# Patient Record
Sex: Female | Born: 1945 | Race: White | Hispanic: No | Marital: Married | Smoking: Never smoker
Health system: Southern US, Community
[De-identification: ages and names within clinical notes are randomized; demographics above are authoritative.]

## PROBLEM LIST (undated history)

## (undated) DIAGNOSIS — R519 Headache, unspecified: Secondary | ICD-10-CM

## (undated) DIAGNOSIS — E785 Hyperlipidemia, unspecified: Secondary | ICD-10-CM

## (undated) DIAGNOSIS — C801 Malignant (primary) neoplasm, unspecified: Secondary | ICD-10-CM

## (undated) DIAGNOSIS — E119 Type 2 diabetes mellitus without complications: Secondary | ICD-10-CM

## (undated) DIAGNOSIS — N6011 Diffuse cystic mastopathy of right breast: Secondary | ICD-10-CM

## (undated) DIAGNOSIS — I10 Essential (primary) hypertension: Secondary | ICD-10-CM

## (undated) HISTORY — PX: OTHER SURGICAL HISTORY: SHX169

## (undated) HISTORY — PX: COLONOSCOPY WITH PROPOFOL: SHX5780

## (undated) HISTORY — PX: ABDOMINAL HYSTERECTOMY: SHX81

## (undated) HISTORY — PX: LAMINECTOMY: SHX219

---

## 1999-01-17 HISTORY — PX: BREAST BIOPSY: SHX20

## 2002-01-16 HISTORY — PX: BREAST BIOPSY: SHX20

## 2004-05-20 ENCOUNTER — Ambulatory Visit: Payer: Self-pay | Admitting: Internal Medicine

## 2004-05-25 ENCOUNTER — Ambulatory Visit: Payer: Self-pay | Admitting: Internal Medicine

## 2004-05-31 ENCOUNTER — Ambulatory Visit: Payer: Self-pay | Admitting: Internal Medicine

## 2004-09-29 ENCOUNTER — Ambulatory Visit: Payer: Self-pay | Admitting: Obstetrics and Gynecology

## 2005-10-02 ENCOUNTER — Ambulatory Visit: Payer: Self-pay | Admitting: Obstetrics and Gynecology

## 2006-11-08 ENCOUNTER — Ambulatory Visit: Payer: Self-pay | Admitting: Obstetrics and Gynecology

## 2007-11-12 ENCOUNTER — Ambulatory Visit: Payer: Self-pay | Admitting: Obstetrics and Gynecology

## 2008-11-13 ENCOUNTER — Ambulatory Visit: Payer: Self-pay | Admitting: Obstetrics and Gynecology

## 2009-11-17 ENCOUNTER — Ambulatory Visit: Payer: Self-pay | Admitting: Obstetrics and Gynecology

## 2009-11-30 ENCOUNTER — Ambulatory Visit: Payer: Self-pay | Admitting: Obstetrics and Gynecology

## 2011-01-04 ENCOUNTER — Ambulatory Visit: Payer: Self-pay | Admitting: Obstetrics and Gynecology

## 2011-09-07 ENCOUNTER — Ambulatory Visit: Payer: Self-pay | Admitting: Obstetrics and Gynecology

## 2011-09-07 LAB — URINALYSIS, COMPLETE
Bilirubin,UR: NEGATIVE
Glucose,UR: NEGATIVE mg/dL (ref 0–75)
Ketone: NEGATIVE
Leukocyte Esterase: NEGATIVE
Nitrite: NEGATIVE
Ph: 7 (ref 4.5–8.0)
RBC,UR: 1 /HPF (ref 0–5)
Squamous Epithelial: 2

## 2011-09-07 LAB — POTASSIUM: Potassium: 3.4 mmol/L — ABNORMAL LOW (ref 3.5–5.1)

## 2011-09-11 ENCOUNTER — Ambulatory Visit: Payer: Self-pay | Admitting: Obstetrics and Gynecology

## 2011-09-12 LAB — PATHOLOGY REPORT

## 2011-11-28 ENCOUNTER — Ambulatory Visit: Payer: Self-pay | Admitting: Obstetrics and Gynecology

## 2011-11-28 LAB — URINALYSIS, COMPLETE
Bilirubin,UR: NEGATIVE
Blood: NEGATIVE
Glucose,UR: >=500 mg/dL (ref 0–75)
Ketone: NEGATIVE
Leukocyte Esterase: NEGATIVE
Ph: 6 (ref 4.5–8.0)
Protein: NEGATIVE
RBC,UR: NONE SEEN /HPF (ref 0–5)
Specific Gravity: 1 (ref 1.003–1.030)

## 2011-11-28 LAB — POTASSIUM: Potassium: 3.2 mmol/L — ABNORMAL LOW (ref 3.5–5.1)

## 2011-12-04 ENCOUNTER — Ambulatory Visit: Payer: Self-pay | Admitting: Obstetrics and Gynecology

## 2011-12-05 LAB — PATHOLOGY REPORT

## 2011-12-05 LAB — HEMATOCRIT: HCT: 33.8 % — ABNORMAL LOW (ref 35.0–47.0)

## 2012-02-07 ENCOUNTER — Ambulatory Visit: Payer: Self-pay | Admitting: Obstetrics and Gynecology

## 2012-02-20 ENCOUNTER — Ambulatory Visit: Payer: Self-pay | Admitting: Neurology

## 2013-02-25 ENCOUNTER — Ambulatory Visit: Payer: Self-pay | Admitting: Obstetrics and Gynecology

## 2013-04-24 DIAGNOSIS — E782 Mixed hyperlipidemia: Secondary | ICD-10-CM | POA: Insufficient documentation

## 2013-04-24 DIAGNOSIS — I10 Essential (primary) hypertension: Secondary | ICD-10-CM | POA: Insufficient documentation

## 2013-04-24 DIAGNOSIS — E118 Type 2 diabetes mellitus with unspecified complications: Secondary | ICD-10-CM | POA: Insufficient documentation

## 2013-11-10 DIAGNOSIS — R413 Other amnesia: Secondary | ICD-10-CM | POA: Insufficient documentation

## 2013-11-10 DIAGNOSIS — E611 Iron deficiency: Secondary | ICD-10-CM | POA: Insufficient documentation

## 2013-11-24 DIAGNOSIS — R32 Unspecified urinary incontinence: Secondary | ICD-10-CM | POA: Insufficient documentation

## 2014-03-10 ENCOUNTER — Ambulatory Visit: Payer: Self-pay | Admitting: Obstetrics and Gynecology

## 2014-05-05 NOTE — Op Note (Signed)
PATIENT NAME:  Catherine Cross, Catherine Cross MR#:  [id number] DATE OF BIRTH:  [birth year]  DATE OF PROCEDURE:  12/04/2011  PREOPERATIVE DIAGNOSES:  1. Postmenopausal bleeding.  2. Status post dilation and curettage with benign endometrium. 3. Recurrent polyps.   POSTOPERATIVE DIAGNOSES:  1. Postmenopausal bleeding.  2. Status post dilation and curettage with benign endometrium. 3. Recurrent polyps.   PROCEDURE: Laparoscopic supracervical hysterectomy, bilateral salpingo-oophorectomy.   SURGEON: Ricky L. Amalia Hailey, MD  ASSISTANT: Laverta Baltimore, MD  ANESTHESIA: General endotracheal.   FINDINGS: Uterus with bilateral omental adhesions to the adnexal complexes, grossly normal tubes, otherwise small fibroid evident on the uterus, excellent hemostasis and cosmesis.   ESTIMATED BLOOD LOSS: 50 mL.  COMPLICATIONS: None.   DRAINS: Intraoperative Foley, was discontinued at the end of the case.   SPECIMENS: Uterus, tubes, and ovaries.   ANTIBIOTICS: One gram Ancef given IV preoperatively.   PROCEDURE IN DETAIL: The patient was consented. Preoperative antibiotics were given. She was taken to the Operating Room and placed in the supine position. Anesthesia was initiated. She was prepped and draped in the usual sterile fashion. The cervix was visualized. Single-tooth and sound were placed and Steri-Stripped together. Foley catheter was placed. Attention was then turned to the abdomen.   An 11 port was placed infraumbilically and pneumoperitoneum was established. The patient was placed in Trendelenburg position and 11 mm ports were placed in bilateral lower quadrants. I then began dissection using the ALTRUS device releasing the omental complex on the left side, divided the round ligament, and identified and divided the IP. Electrocautery was used for hemostasis and for lysing. Developed the left broad ligament and cauterized the left uterine artery. We proceeded in a similar fashion on the right. We were  unable to develop the uterine stump using the ALTRUS. I attempted to use disposable scissors; these were too light for this procedure. Therefore, I opened a Harmonic scalpel and developed the cervical stump in the usual fashion. The cervix was removed in morcellator fashion through the left lower quadrant wound. The left lower quadrant port was replaced, the pelvis was copiously irrigated, and areas were seen to be hemostatic. Cervical canal was cauterized while hand of operator was in the vagina to ensure no upper vaginal trauma. Pressure was lowered to 6 mmHg. The area was again seen to be hemostatic. The procedure was felt to have achieved maximum efficacy. Ports were removed. Pneumoperitoneum was allowed to resolve. Incisions were closed with deep of zero and subcutaneous with 3-0 Vicryl. Steri-Strips and Band-Aids were placed.  The patient tolerated the procedure well. 26 mL of 0.5% Sensorcaine was used in three separate aliquots prior to port placement. The patient tolerated the procedure well. I anticipate a routine postoperative course.  ____________________________ Rockey Situ. Amalia Hailey, MD rle:slb D: 12/04/2011 09:34:42 ET T: 12/04/2011 10:33:05 ET JOB#: [id number]  cc: Ricky L. Amalia Hailey, MD, <Dictator> Selmer Dominion MD ELECTRONICALLY SIGNED 12/06/2011 10:46

## 2014-05-05 NOTE — Op Note (Signed)
PATIENT NAME:  Catherine Cross, WAH MR#:  [id number] DATE OF BIRTH:  21-Apr-1945  DATE OF PROCEDURE:  09/11/2011  PREOPERATIVE DIAGNOSIS: Postmenopausal bleeding with thickened endometrium with benign biopsy in the office.   POSTOPERATIVE DIAGNOSES:    1. Postmenopausal bleeding with thickened endometrium with benign biopsy in the office.  2. Polyp.   PROCEDURES:  1. Hysteroscopy.  2. Dilation and curettage.   SURGEON: Ricky L. Amalia Hailey, M.D.   ANESTHESIA: General endotracheal.   FINDINGS: Fleshy polyp, unremarkable hysteroscopy otherwise. Minimal amount of curettings otherwise.  ESTIMATED BLOOD LOSS: Minimal.  COMPLICATIONS: None.  SPECIMENS: Curettings with polyp.  DRAINS: In and out catheter with red rubber at the end of the case, approximately 30 mL.   PROCEDURE IN DETAIL: The patient has been on minimal hormone replacement therapy for several years. Spotting after recent adjustment of dosage. Ultrasound showed thickened endometrium, biopsy benign. Recommended dilation and curettage. The patient consented. Consent signed. Preoperative antibiotics given. Taken to the Operating Room and placed in the supine position where anesthesia was initiated, prepped and draped in the usual sterile fashion. After being placed in the dorsal lithotomy position using Allen stirrups, the cervix was visualized, grasped with a single-tooth tenaculum, easily dilated to permit hysteroscope with findings as noted above.   Polyp was seen to be at approximately 10 o'clock position and approximately two-thirds of the way up toward the fundus. This was then removed blind with polyp forceps. Picture was taken to assure complete removal and then general global curettage was carried out. There was good uterine cry felt throughout and minimal return of curettings. The procedure was felt to achieve maximum efficacy. Instruments were removed. Cervix was seen to be hemostatic. The bladder was drained. The patient was turned to  the supine position and left to the care of anesthesia.   The patient tolerated the procedure well. I anticipate a routine postoperative course. I should note that she has significant cystocele which seems to be asymptomatic, but it would most likely require repair should the patient desire correction.   ____________________________ Rockey Situ. Amalia Hailey, MD rle:ap D: 09/11/2011 08:06:27 ET T: 09/11/2011 10:54:35 ET JOB#: [id number]  cc: Audry Pili L. Amalia Hailey, MD, <Dictator> Selmer Dominion MD ELECTRONICALLY SIGNED 09/12/2011 9:19

## 2014-07-28 DIAGNOSIS — N898 Other specified noninflammatory disorders of vagina: Secondary | ICD-10-CM | POA: Insufficient documentation

## 2014-09-08 DIAGNOSIS — C44310 Basal cell carcinoma of skin of unspecified parts of face: Secondary | ICD-10-CM | POA: Insufficient documentation

## 2014-11-17 DIAGNOSIS — E538 Deficiency of other specified B group vitamins: Secondary | ICD-10-CM | POA: Insufficient documentation

## 2014-12-14 DIAGNOSIS — N813 Complete uterovaginal prolapse: Secondary | ICD-10-CM | POA: Insufficient documentation

## 2015-03-11 DIAGNOSIS — M67449 Ganglion, unspecified hand: Secondary | ICD-10-CM | POA: Insufficient documentation

## 2015-05-13 ENCOUNTER — Other Ambulatory Visit: Payer: Self-pay | Admitting: Obstetrics and Gynecology

## 2015-05-13 DIAGNOSIS — Z1231 Encounter for screening mammogram for malignant neoplasm of breast: Secondary | ICD-10-CM

## 2015-05-18 ENCOUNTER — Other Ambulatory Visit: Payer: Self-pay | Admitting: Obstetrics and Gynecology

## 2015-05-18 ENCOUNTER — Ambulatory Visit
Admission: RE | Admit: 2015-05-18 | Discharge: 2015-05-18 | Disposition: A | Discharge status: Home / Self Care | Payer: Medicare Other | Source: Ambulatory Visit | Attending: Obstetrics and Gynecology | Admitting: Obstetrics and Gynecology

## 2015-05-18 DIAGNOSIS — Z1231 Encounter for screening mammogram for malignant neoplasm of breast: Secondary | ICD-10-CM | POA: Insufficient documentation

## 2015-05-21 ENCOUNTER — Other Ambulatory Visit: Payer: Self-pay | Admitting: Obstetrics and Gynecology

## 2015-05-21 DIAGNOSIS — N6459 Other signs and symptoms in breast: Secondary | ICD-10-CM

## 2015-05-25 ENCOUNTER — Other Ambulatory Visit: Payer: Self-pay | Admitting: Obstetrics and Gynecology

## 2015-05-25 DIAGNOSIS — N6489 Other specified disorders of breast: Secondary | ICD-10-CM

## 2015-05-27 ENCOUNTER — Ambulatory Visit
Admission: RE | Admit: 2015-05-27 | Discharge: 2015-05-27 | Disposition: A | Discharge status: Home / Self Care | Payer: Medicare Other | Source: Ambulatory Visit | Attending: Obstetrics and Gynecology | Admitting: Obstetrics and Gynecology

## 2015-05-27 DIAGNOSIS — N6002 Solitary cyst of left breast: Secondary | ICD-10-CM | POA: Diagnosis not present

## 2015-05-27 DIAGNOSIS — N6489 Other specified disorders of breast: Secondary | ICD-10-CM | POA: Diagnosis present

## 2015-06-21 DIAGNOSIS — N819 Female genital prolapse, unspecified: Secondary | ICD-10-CM | POA: Insufficient documentation

## 2015-06-21 DIAGNOSIS — N3946 Mixed incontinence: Secondary | ICD-10-CM | POA: Insufficient documentation

## 2016-05-05 ENCOUNTER — Other Ambulatory Visit: Payer: Self-pay | Admitting: Internal Medicine

## 2016-05-05 DIAGNOSIS — Z1231 Encounter for screening mammogram for malignant neoplasm of breast: Secondary | ICD-10-CM

## 2016-05-25 ENCOUNTER — Ambulatory Visit
Admission: RE | Admit: 2016-05-25 | Discharge: 2016-05-25 | Disposition: A | Discharge status: Home / Self Care | Payer: Medicare Other | Source: Ambulatory Visit | Attending: Internal Medicine | Admitting: Internal Medicine

## 2016-05-25 ENCOUNTER — Encounter (HOSPITAL_COMMUNITY): Payer: Self-pay

## 2016-05-25 DIAGNOSIS — Z1231 Encounter for screening mammogram for malignant neoplasm of breast: Secondary | ICD-10-CM | POA: Diagnosis present

## 2016-12-20 ENCOUNTER — Other Ambulatory Visit: Payer: Self-pay | Admitting: Internal Medicine

## 2016-12-20 DIAGNOSIS — R748 Abnormal levels of other serum enzymes: Secondary | ICD-10-CM

## 2016-12-28 ENCOUNTER — Ambulatory Visit
Admission: RE | Admit: 2016-12-28 | Discharge: 2016-12-28 | Disposition: A | Discharge status: Home / Self Care | Payer: Medicare Other | Source: Ambulatory Visit | Attending: Internal Medicine | Admitting: Internal Medicine

## 2016-12-28 DIAGNOSIS — K760 Fatty (change of) liver, not elsewhere classified: Secondary | ICD-10-CM | POA: Insufficient documentation

## 2016-12-28 DIAGNOSIS — R748 Abnormal levels of other serum enzymes: Secondary | ICD-10-CM | POA: Insufficient documentation

## 2016-12-28 DIAGNOSIS — K824 Cholesterolosis of gallbladder: Secondary | ICD-10-CM | POA: Insufficient documentation

## 2017-03-20 IMAGING — MG MM DIGITAL DIAGNOSTIC UNILAT*L* W/ TOMO W/ CAD
6 series · 6 of 14 positions shown · non-contrast
Comparison: Previous exam(s).

CLINICAL DATA: Callback for possible left breast asymmetry

EXAM:
2D DIGITAL DIAGNOSTIC LEFT MAMMOGRAM WITH CAD AND ADJUNCT TOMO
ULTRASOUND LEFT BREAST

[L CC synth-2D]
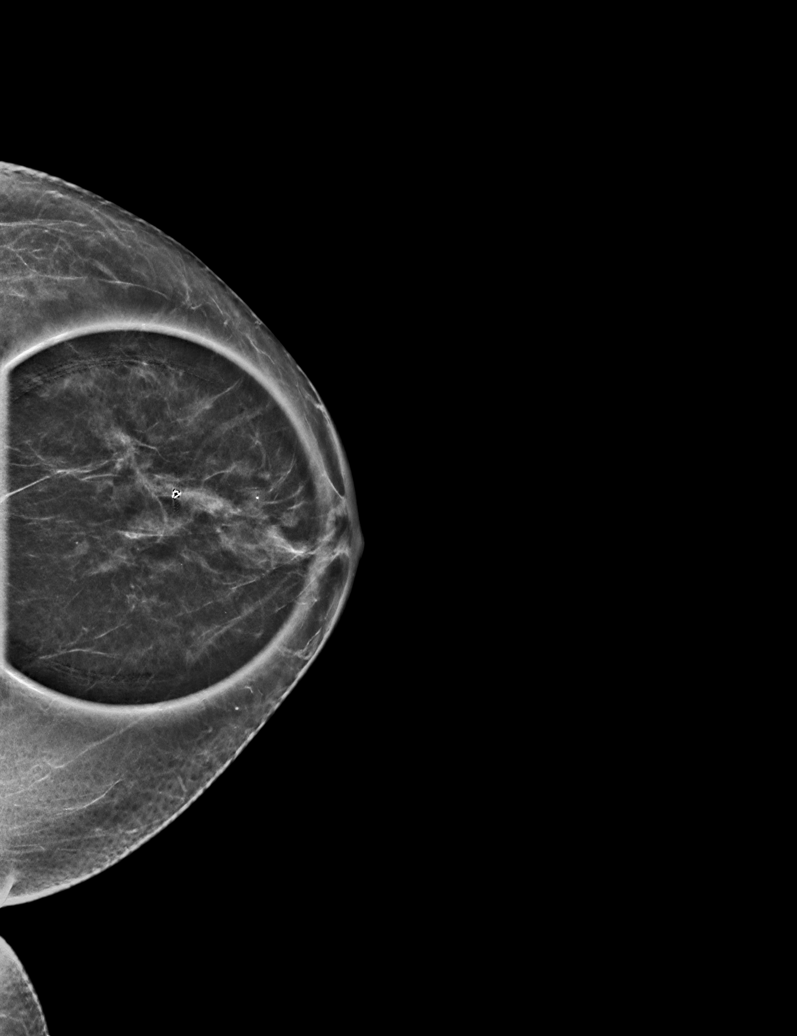

[L ML]
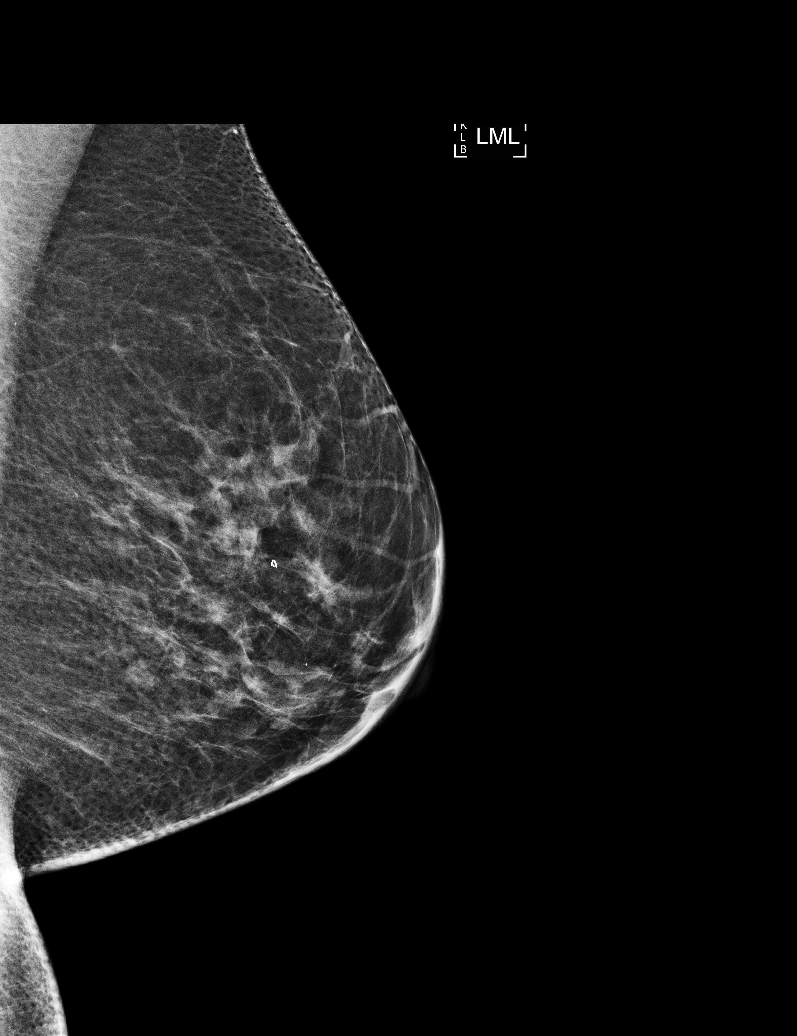

[L ML synth-2D]
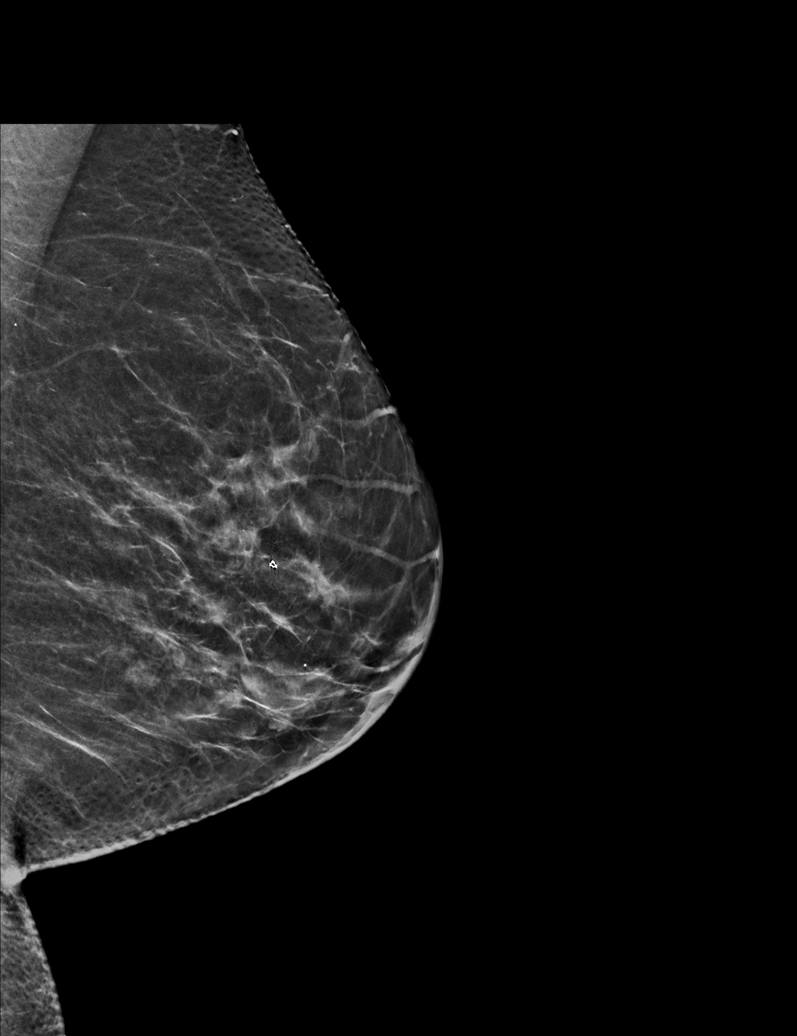

[L CC]
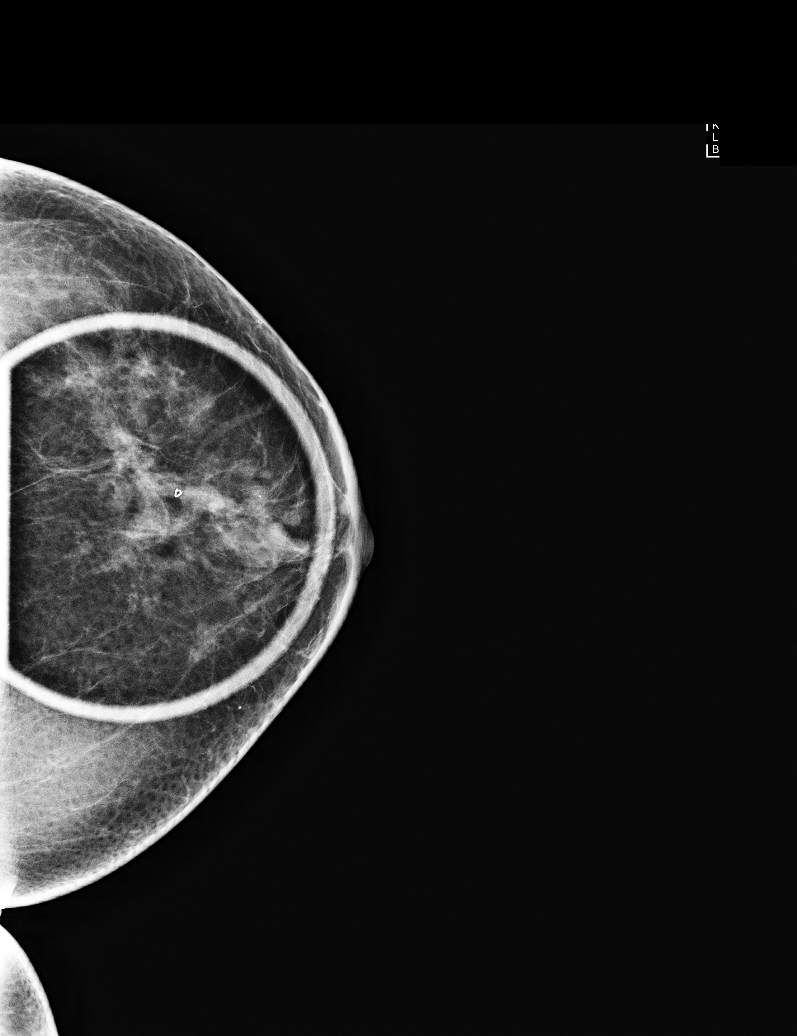

[L ML tomo · tomo slice 29/57.0]
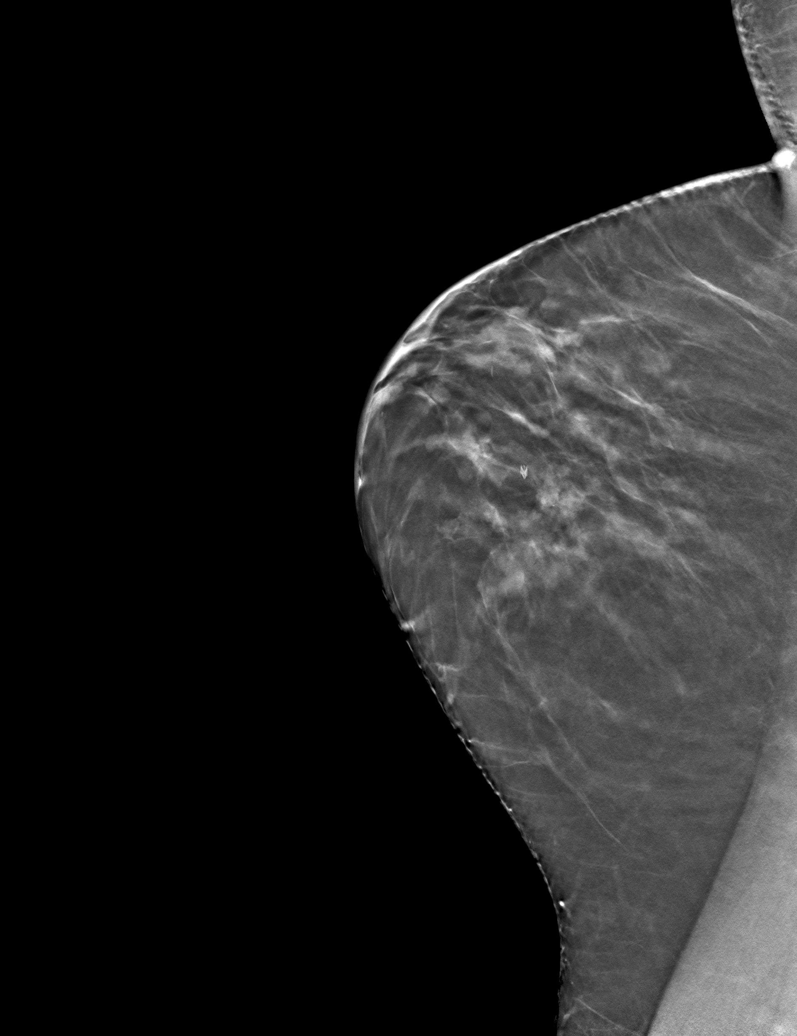

[L CC tomo · tomo slice 25/48.0]
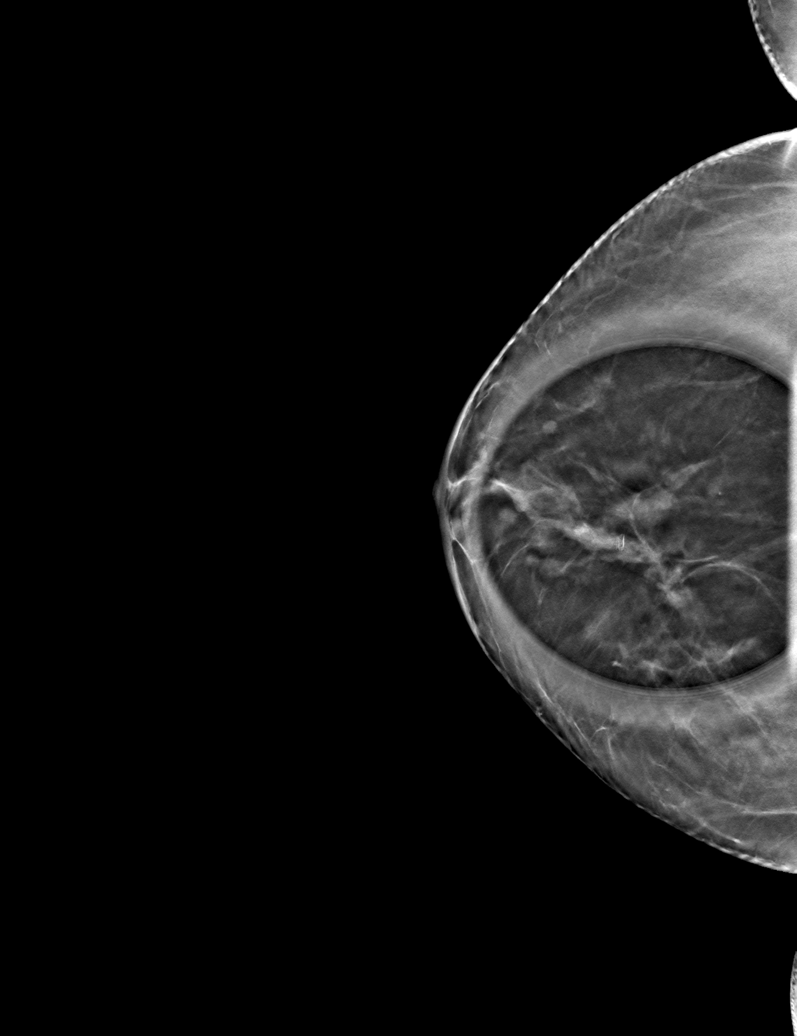

[6 of 14 positions shown; findings below may reference images not displayed]

ACR Breast Density Category c: The breast tissue is heterogeneously
dense, which may obscure small masses.
FINDINGS: CC spot compression view and full lateral views of the left breast
were performed with tomosynthesis. The possible asymmetry identified
on screening mammogram does not persist on spot compression view. No
suspicious abnormality is seen in this region.

Mammographic images were processed with CAD.

On physical exam, no discrete mass is felt in the area of concern
within the slightly lateral left breast.

Targeted ultrasound of the slightly lateral, subareolar left breast
was performed demonstrating no suspicious cystic or solid
sonographic finding in the area of concern. Numerous small cysts
were incidentally noted.
IMPRESSION: No mammographic or sonographic evidence of malignancy.

RECOMMENDATION:
Screening mammogram in one year.(Code:89-3-2V3)

I have discussed the findings and recommendations with the patient.
Results were also provided in writing at the conclusion of the
visit. If applicable, a reminder letter will be sent to the patient
regarding the next appointment.

BI-RADS CATEGORY  1: Negative.

## 2017-06-01 ENCOUNTER — Other Ambulatory Visit: Payer: Self-pay | Admitting: Obstetrics and Gynecology

## 2017-06-22 ENCOUNTER — Other Ambulatory Visit: Payer: Self-pay | Admitting: Obstetrics and Gynecology

## 2017-06-22 DIAGNOSIS — Z1231 Encounter for screening mammogram for malignant neoplasm of breast: Secondary | ICD-10-CM

## 2017-07-24 ENCOUNTER — Ambulatory Visit
Admission: RE | Admit: 2017-07-24 | Discharge: 2017-07-24 | Disposition: A | Discharge status: Home / Self Care | Payer: MEDICARE | Source: Ambulatory Visit | Attending: Obstetrics and Gynecology | Admitting: Obstetrics and Gynecology

## 2017-07-24 DIAGNOSIS — Z1231 Encounter for screening mammogram for malignant neoplasm of breast: Secondary | ICD-10-CM | POA: Diagnosis not present

## 2017-07-24 HISTORY — DX: Malignant (primary) neoplasm, unspecified: C80.1

## 2018-06-25 ENCOUNTER — Other Ambulatory Visit: Payer: Self-pay | Admitting: Obstetrics and Gynecology

## 2018-06-25 DIAGNOSIS — Z1231 Encounter for screening mammogram for malignant neoplasm of breast: Secondary | ICD-10-CM

## 2018-09-10 ENCOUNTER — Ambulatory Visit
Admission: RE | Admit: 2018-09-10 | Discharge: 2018-09-10 | Disposition: A | Discharge status: Home / Self Care | Payer: Medicare HMO | Source: Ambulatory Visit | Attending: Obstetrics and Gynecology | Admitting: Obstetrics and Gynecology

## 2018-09-10 DIAGNOSIS — Z1231 Encounter for screening mammogram for malignant neoplasm of breast: Secondary | ICD-10-CM | POA: Diagnosis not present

## 2019-07-01 ENCOUNTER — Other Ambulatory Visit: Payer: Self-pay | Admitting: Obstetrics and Gynecology

## 2019-07-01 DIAGNOSIS — Z1231 Encounter for screening mammogram for malignant neoplasm of breast: Secondary | ICD-10-CM

## 2019-09-11 ENCOUNTER — Inpatient Hospital Stay: Admission: RE | Admit: 2019-09-11 | Payer: Medicare Other | Source: Ambulatory Visit

## 2019-10-03 ENCOUNTER — Ambulatory Visit
Admission: RE | Admit: 2019-10-03 | Discharge: 2019-10-03 | Disposition: A | Discharge status: Home / Self Care | Payer: Medicare HMO | Source: Ambulatory Visit | Attending: Obstetrics and Gynecology | Admitting: Obstetrics and Gynecology

## 2019-10-03 ENCOUNTER — Other Ambulatory Visit: Payer: Self-pay

## 2019-10-03 DIAGNOSIS — Z1231 Encounter for screening mammogram for malignant neoplasm of breast: Secondary | ICD-10-CM | POA: Diagnosis not present

## 2020-07-21 ENCOUNTER — Other Ambulatory Visit: Payer: Self-pay | Admitting: Obstetrics and Gynecology

## 2020-07-21 DIAGNOSIS — Z1231 Encounter for screening mammogram for malignant neoplasm of breast: Secondary | ICD-10-CM

## 2020-10-04 ENCOUNTER — Other Ambulatory Visit: Payer: Self-pay

## 2020-10-04 ENCOUNTER — Ambulatory Visit
Admission: RE | Admit: 2020-10-04 | Discharge: 2020-10-04 | Disposition: A | Discharge status: Home / Self Care | Payer: Medicare HMO | Source: Ambulatory Visit | Attending: Obstetrics and Gynecology | Admitting: Obstetrics and Gynecology

## 2020-10-04 DIAGNOSIS — Z1231 Encounter for screening mammogram for malignant neoplasm of breast: Secondary | ICD-10-CM | POA: Diagnosis not present

## 2020-10-12 ENCOUNTER — Other Ambulatory Visit: Payer: Self-pay | Admitting: Obstetrics and Gynecology

## 2020-10-12 DIAGNOSIS — R928 Other abnormal and inconclusive findings on diagnostic imaging of breast: Secondary | ICD-10-CM

## 2020-10-12 DIAGNOSIS — N6489 Other specified disorders of breast: Secondary | ICD-10-CM

## 2020-10-18 ENCOUNTER — Ambulatory Visit
Admission: RE | Admit: 2020-10-18 | Discharge: 2020-10-18 | Disposition: A | Discharge status: Home / Self Care | Payer: Medicare HMO | Source: Ambulatory Visit | Attending: Obstetrics and Gynecology | Admitting: Obstetrics and Gynecology

## 2020-10-18 ENCOUNTER — Other Ambulatory Visit: Payer: Self-pay

## 2020-10-18 DIAGNOSIS — N6489 Other specified disorders of breast: Secondary | ICD-10-CM | POA: Insufficient documentation

## 2020-10-18 DIAGNOSIS — R928 Other abnormal and inconclusive findings on diagnostic imaging of breast: Secondary | ICD-10-CM | POA: Insufficient documentation

## 2020-11-24 ENCOUNTER — Encounter: Payer: Self-pay | Admitting: Gastroenterology

## 2020-11-25 ENCOUNTER — Ambulatory Visit: Payer: Medicare HMO | Admitting: Anesthesiology

## 2020-11-25 ENCOUNTER — Encounter
Admission: RE | Disposition: A | Discharge status: Home / Self Care | Payer: Self-pay | Source: Ambulatory Visit | Attending: Gastroenterology

## 2020-11-25 ENCOUNTER — Other Ambulatory Visit: Payer: Self-pay

## 2020-11-25 ENCOUNTER — Encounter: Payer: Self-pay | Admitting: Gastroenterology

## 2020-11-25 ENCOUNTER — Ambulatory Visit
Admission: RE | Admit: 2020-11-25 | Discharge: 2020-11-25 | Disposition: A | Discharge status: Home / Self Care | Payer: Medicare HMO | Source: Ambulatory Visit | Attending: Gastroenterology | Admitting: Gastroenterology

## 2020-11-25 DIAGNOSIS — Z7982 Long term (current) use of aspirin: Secondary | ICD-10-CM | POA: Diagnosis not present

## 2020-11-25 DIAGNOSIS — K5730 Diverticulosis of large intestine without perforation or abscess without bleeding: Secondary | ICD-10-CM | POA: Diagnosis not present

## 2020-11-25 DIAGNOSIS — Z791 Long term (current) use of non-steroidal anti-inflammatories (NSAID): Secondary | ICD-10-CM | POA: Insufficient documentation

## 2020-11-25 DIAGNOSIS — D125 Benign neoplasm of sigmoid colon: Secondary | ICD-10-CM | POA: Insufficient documentation

## 2020-11-25 DIAGNOSIS — K640 First degree hemorrhoids: Secondary | ICD-10-CM | POA: Diagnosis not present

## 2020-11-25 DIAGNOSIS — Z1211 Encounter for screening for malignant neoplasm of colon: Secondary | ICD-10-CM | POA: Insufficient documentation

## 2020-11-25 DIAGNOSIS — E119 Type 2 diabetes mellitus without complications: Secondary | ICD-10-CM | POA: Diagnosis not present

## 2020-11-25 DIAGNOSIS — Z7984 Long term (current) use of oral hypoglycemic drugs: Secondary | ICD-10-CM | POA: Insufficient documentation

## 2020-11-25 HISTORY — DX: Type 2 diabetes mellitus without complications: E11.9

## 2020-11-25 HISTORY — DX: Headache, unspecified: R51.9

## 2020-11-25 HISTORY — DX: Diffuse cystic mastopathy of right breast: N60.11

## 2020-11-25 HISTORY — DX: Essential (primary) hypertension: I10

## 2020-11-25 HISTORY — PX: COLONOSCOPY: SHX5424

## 2020-11-25 HISTORY — DX: Hyperlipidemia, unspecified: E78.5

## 2020-11-25 LAB — GLUCOSE, CAPILLARY: Glucose-Capillary: 153 mg/dL — ABNORMAL HIGH (ref 70–99)

## 2020-11-25 SURGERY — COLONOSCOPY
Anesthesia: General

## 2020-11-25 MED ORDER — SODIUM CHLORIDE 0.9 % IV SOLN: INTRAVENOUS | Status: DC

## 2020-11-25 MED ORDER — LIDOCAINE HCL (CARDIAC) PF 100 MG/5ML IV SOSY
PREFILLED_SYRINGE | INTRAVENOUS | Status: DC | PRN
  Administered 2020-11-25: 50 mg via INTRAVENOUS

## 2020-11-25 MED ORDER — PROPOFOL 500 MG/50ML IV EMUL
INTRAVENOUS | Status: AC
  Filled 2020-11-25: qty 50

## 2020-11-25 MED ORDER — PROPOFOL 500 MG/50ML IV EMUL
INTRAVENOUS | Status: DC | PRN
  Administered 2020-11-25: 150 ug/kg/min via INTRAVENOUS

## 2020-11-25 MED ORDER — SIMETHICONE 40 MG/0.6ML PO SUSP
ORAL | Status: DC | PRN
  Administered 2020-11-25: 100 mL

## 2020-11-25 MED ORDER — PROPOFOL 10 MG/ML IV BOLUS
INTRAVENOUS | Status: DC | PRN
  Administered 2020-11-25: 60 mg via INTRAVENOUS

## 2020-11-25 NOTE — Anesthesia Procedure Notes (Signed)
Date/Time: 11/25/2020 8:25 AM Performed by: Johnna Acosta, CRNA Pre-anesthesia Checklist: Patient identified, Emergency Drugs available, Suction available, Patient being monitored and Timeout performed Patient Re-evaluated:Patient Re-evaluated prior to induction Oxygen Delivery Method: Nasal cannula Preoxygenation: Pre-oxygenation with 100% oxygen Induction Type: IV induction

## 2020-11-25 NOTE — Transfer of Care (Signed)
Immediate Anesthesia Transfer of Care Note  Patient: Catherine Cross  Procedure(s) Performed: COLONOSCOPY  Patient Location: PACU  Anesthesia Type:General  Level of Consciousness: sedated  Airway & Oxygen Therapy: Patient Spontanous Breathing  Post-op Assessment: Report given to RN and Post -op Vital signs reviewed and stable  Post vital signs: Reviewed and stable  Last Vitals:  Vitals Value Taken Time  BP 114/58 11/25/20 0856  Temp    Pulse 70 11/25/20 0856  Resp 16 11/25/20 0856  SpO2 99 % 11/25/20 0856  Vitals shown include unvalidated device data.  Last Pain:  Vitals:   11/25/20 0804  TempSrc: Temporal  PainSc: 0-No pain         Complications: No notable events documented.

## 2020-11-25 NOTE — Interval H&P Note (Signed)
History and Physical Interval Note: Preprocedure H&P from 11/25/20  was reviewed and there was no interval change after seeing and examining the patient.  Written consent was obtained from the patient after discussion of risks, benefits, and alternatives. Patient has consented to proceed with Colonoscopy with possible intervention   11/25/2020 8:21 AM  Catherine Cross  has presented today for surgery, with the diagnosis of Colon cancer screening (Z12.11).  The various methods of treatment have been discussed with the patient and family. After consideration of risks, benefits and other options for treatment, the patient has consented to  Procedure(s) with comments: COLONOSCOPY (N/A) - DM as a surgical intervention.  The patient's history has been reviewed, patient examined, no change in status, stable for surgery.  I have reviewed the patient's chart and labs.  Questions were answered to the patient's satisfaction.     [name]

## 2020-11-25 NOTE — Op Note (Signed)
[facility] Patient Name: Catherine Cross Procedure Date: 11/25/2020 8:14 AM MRN: [id number] Account #: [id number] Date of Birth: 26-Oct-1945 Admit Type: Outpatient Age: 75 Room: [facility] ENDO ROOM 1 Gender: Female Note Status: Finalized Instrument Name: Colonoscope [id number] Procedure:             Colonoscopy Indications:           Screening for colorectal malignant neoplasm Providers:             [name] DO, DO Referring MD:          Boykin Nearing, MD (Referring MD) Medicines:             Monitored Anesthesia Care Complications:         No immediate complications. Estimated blood loss:                         Minimal. Procedure:             Pre-Anesthesia Assessment:                        - Prior to the procedure, a History and Physical was                         performed, and patient medications and allergies were                         reviewed. The patient is competent. The risks and                         benefits of the procedure and the sedation options and                         risks were discussed with the patient. All questions                         were answered and informed consent was obtained.                         Patient identification and proposed procedure were                         verified by the physician, the nurse, the anesthetist                         and the technician in the endoscopy suite. Mental                         Status Examination: alert and oriented. Airway                         Examination: normal oropharyngeal airway and neck                         mobility. Respiratory Examination: clear to                         auscultation. CV Examination: RRR, no murmurs, no S3  or S4. Prophylactic Antibiotics: The patient does not                         require prophylactic antibiotics. Prior                         Anticoagulants: The patient has taken no  previous                         anticoagulant or antiplatelet agents. ASA Grade                         Assessment: III - A patient with severe systemic                         disease. After reviewing the risks and benefits, the                         patient was deemed in satisfactory condition to                         undergo the procedure. The anesthesia plan was to use                         monitored anesthesia care (MAC). Immediately prior to                         administration of medications, the patient was                         re-assessed for adequacy to receive sedatives. The                         heart rate, respiratory rate, oxygen saturations,                         blood pressure, adequacy of pulmonary ventilation, and                         response to care were monitored throughout the                         procedure. The physical status of the patient was                         re-assessed after the procedure.                        After obtaining informed consent, the colonoscope was                         passed under direct vision. Throughout the procedure,                         the patient's blood pressure, pulse, and oxygen                         saturations were monitored continuously. The  Colonoscope was introduced through the anus and                         advanced to the the cecum, identified by appendiceal                         orifice and ileocecal valve. The colonoscopy was                         performed without difficulty. The patient tolerated                         the procedure well. The quality of the bowel                         preparation was evaluated using the BBPS [facility] Bowel                         Preparation Scale) with scores of: Right Colon = 3,                         Transverse Colon = 3 and Left Colon = 3 (entire mucosa                         seen well with no residual staining, small  fragments                         of stool or opaque liquid). The total BBPS score                         equals 9. The ileocecal valve, appendiceal orifice,                         and rectum were photographed. Findings:      The perianal and digital rectal examinations were normal. Pertinent       negatives include normal sphincter tone.      A 2 to 3 mm polyp was found in the sigmoid colon. The polyp was sessile.       The polyp was removed with a cold biopsy forceps. Resection and       retrieval were complete. Estimated blood loss was minimal.      Multiple small-mouthed diverticula were found in the recto-sigmoid colon       and sigmoid colon. Estimated blood loss: none.      Non-bleeding internal hemorrhoids were found during retroflexion. The       hemorrhoids were Grade I (internal hemorrhoids that do not prolapse).      The exam was otherwise without abnormality on direct and retroflexion       views. Impression:            - One 2 to 3 mm polyp in the sigmoid colon, removed                         with a cold biopsy forceps. Resected and retrieved.                        - Diverticulosis in the recto-sigmoid colon and in  the                         sigmoid colon.                        - Non-bleeding internal hemorrhoids.                        - The examination was otherwise normal on direct and                         retroflexion views. Recommendation:        - Discharge patient to home.                        - Resume previous diet.                        - Continue present medications.                        - Await pathology results.                        - Repeat colonoscopy for surveillance based on                         pathology results.                        - Return to referring physician as previously                         scheduled. Procedure Code(s):     --- Professional ---                        [phone], Colonoscopy, flexible; with biopsy, single or                          multiple Diagnosis Code(s):     --- Professional ---                        Z12.11, Encounter for screening for malignant neoplasm                         of colon                        K63.5, Polyp of colon                        K64.0, First degree hemorrhoids                        K57.30, Diverticulosis of large intestine without                         perforation or abscess without bleeding CPT copyright 2019 [employer]. All rights reserved. The codes documented in this report are preliminary and upon coder review may  be revised to meet current compliance requirements. Attending Participation:      I personally performed the  entire procedure. Volney American, DO [name] DO, DO 11/25/2020 8:56:10 AM This report has been signed electronically. Number of Addenda: 0 Note Initiated On: 11/25/2020 8:14 AM Scope Withdrawal Time: 0 hours 13 minutes 58 seconds  Total Procedure Duration: 0 hours 24 minutes 32 seconds  Estimated Blood Loss:  Estimated blood loss was minimal.      [facility]

## 2020-11-25 NOTE — Anesthesia Preprocedure Evaluation (Signed)
Anesthesia Evaluation  Patient identified by MRN, date of birth, ID band Patient awake    Reviewed: Allergy & Precautions, NPO status , Patient's Chart, lab work & pertinent test results  History of Anesthesia Complications Negative for: history of anesthetic complications  Airway Mallampati: III  TM Distance: <3 FB Neck ROM: full    Dental  (+) Chipped   Pulmonary neg pulmonary ROS, neg shortness of breath,    Pulmonary exam normal        Cardiovascular negative cardio ROS Normal cardiovascular exam     Neuro/Psych  Headaches, negative psych ROS   GI/Hepatic negative GI ROS, Neg liver ROS, neg GERD  ,  Endo/Other  diabetes, Type 2  Renal/GU negative Renal ROS  negative genitourinary   Musculoskeletal   Abdominal   Peds  Hematology negative hematology ROS (+)   Anesthesia Other Findings Past Medical History: No date: Cancer ([place])     Comment:  skin No date: Diabetes mellitus without complication ([place]) No date: Fibrocystic disease of both breasts No date: Headache No date: Hyperlipidemia  Past Surgical History: No date: ABDOMINAL HYSTERECTOMY 2001: BREAST BIOPSY; Left     Comment:  benign 2004: BREAST BIOPSY; Right     Comment:  benign No date: cervical trachelectomy No date: COLONOSCOPY WITH PROPOFOL No date: LAMINECTOMY No date: pessary No date: tvt mid urethral sling, a/p repair     Reproductive/Obstetrics negative OB ROS                             Anesthesia Physical Anesthesia Plan  ASA: 3  Anesthesia Plan: General   Post-op Pain Management:    Induction: Intravenous  PONV Risk Score and Plan: Propofol infusion and TIVA  Airway Management Planned: Natural Airway and Nasal Cannula  Additional Equipment:   Intra-op Plan:   Post-operative Plan:   Informed Consent: I have reviewed the patients History and Physical, chart, labs and discussed the procedure  including the risks, benefits and alternatives for the proposed anesthesia with the patient or authorized representative who has indicated his/her understanding and acceptance.     Dental Advisory Given  Plan Discussed with: Anesthesiologist, CRNA and Surgeon  Anesthesia Plan Comments: (Patient consented for risks of anesthesia including but not limited to:  - adverse reactions to medications - risk of airway placement if required - damage to eyes, teeth, lips or other oral mucosa - nerve damage due to positioning  - sore throat or hoarseness - Damage to heart, brain, nerves, lungs, other parts of body or loss of life  Patient voiced understanding.)        Anesthesia Quick Evaluation

## 2020-11-25 NOTE — H&P (Signed)
[facility] Pre-Procedure H&P   Patient ID: Catherine Cross is a 75 y.o. female.  Gastroenterology Provider: [name], DO  Referring Provider: Laurine Blazer, PA PCP: Baxter Hire, MD  Date: 11/25/2020  HPI Ms. Catherine Cross is a 75 y.o. female who presents today for Colonoscopy for 10 year screening colonoscopy.  Bowels have been moving well- no constipation, diarrhea, melena, hematochezia, abnormal weight loss, appetite changes.  History of iron deficiency, however, last hgb was 114.5 with mcv of 89. A1c up to 8.2. FOBT negative 07/2020.   Takes asa 81, ibuprofen, and metformin  No fhx crc or colon polyps.   Past Medical History:  Diagnosis Date   Cancer ([place])    skin   Diabetes mellitus without complication ([place])    Fibrocystic disease of both breasts    Headache    Hyperlipidemia    Hypertension     Past Surgical History:  Procedure Laterality Date   ABDOMINAL HYSTERECTOMY     BREAST BIOPSY Left 2001   benign   BREAST BIOPSY Right 2004   benign   cervical trachelectomy     COLONOSCOPY WITH PROPOFOL     LAMINECTOMY     pessary     tvt mid urethral sling, a/p repair      Family History No h/o GI disease or malignancy  Review of Systems  Constitutional:  Negative for activity change, appetite change, chills, fatigue, fever and unexpected weight change.  HENT:  Negative for trouble swallowing and voice change.   Respiratory:  Negative for shortness of breath and wheezing.   Cardiovascular:  Negative for chest pain, palpitations and leg swelling.  Gastrointestinal:  Negative for abdominal distention, abdominal pain, anal bleeding, blood in stool, constipation, diarrhea, nausea, rectal pain and vomiting.  Musculoskeletal:  Negative for arthralgias and myalgias.  Skin:  Negative for color change and pallor.  Neurological:  Negative for dizziness, syncope and weakness.  Psychiatric/Behavioral:  Negative for agitation and confusion.    All other systems reviewed and are negative.   Medications No current facility-administered medications on file prior to encounter.   Current Outpatient Medications on File Prior to Encounter  Medication Sig Dispense Refill   acetaminophen (TYLENOL) 500 MG tablet Take 500 mg by mouth every 6 (six) hours as needed.     alendronate (FOSAMAX) 70 MG tablet Take 70 mg by mouth once a week. Take with a full glass of water on an empty stomach.     Ascorbic Acid (VITAMIN C) 100 MG tablet Take 100 mg by mouth daily.     aspirin EC 81 MG tablet Take 81 mg by mouth daily. Swallow whole.     b complex vitamins capsule Take 1 capsule by mouth daily.     CALCIUM CARBONATE-VITAMIN D PO Take by mouth.     Cinnamon Bark POWD by Does not apply route.     estradiol (ESTRACE) 1 MG tablet Take 1 mg by mouth daily.     glipiZIDE (GLUCOTROL) 10 MG tablet Take 10 mg by mouth daily before breakfast.     hydrochlorothiazide (HYDRODIURIL) 25 MG tablet Take 25 mg by mouth daily.     ibuprofen (ADVIL) 200 MG tablet Take 200 mg by mouth every 6 (six) hours as needed.     latanoprost (XALATAN) 0.005 % ophthalmic solution 1 drop at bedtime.     metFORMIN (GLUCOPHAGE) 1000 MG tablet Take 1,000 mg by mouth 2 (two) times daily with a meal.  polyethylene glycol-electrolytes (NULYTELY) 420 g solution Take 4,000 mLs by mouth once.     simvastatin (ZOCOR) 40 MG tablet Take 40 mg by mouth daily.     sitaGLIPtin (JANUVIA) 25 MG tablet Take 25 mg by mouth daily.      Pertinent medications related to GI and procedure were reviewed by me with the patient prior to the procedure   Current Facility-Administered Medications:    0.9 %  sodium chloride infusion, , Intravenous, Continuous, [name], DO, Last Rate: 20 mL/hr at 11/25/20 7322, Restarted at 11/25/20 0817      No Known Allergies Allergies were reviewed by me prior to the procedure  Objective    Vitals:   11/25/20 0804  BP: (!) 154/77  Pulse: 79   Resp: 16  Temp: (!) 96.1 F (35.6 C)  TempSrc: Temporal  SpO2: 98%  Weight: 54.4 kg  Height: 5' (1.524 m)     Physical Exam Vitals reviewed.  Constitutional:      General: She is not in acute distress.    Appearance: Normal appearance. She is not ill-appearing, toxic-appearing or diaphoretic.  HENT:     Head: Normocephalic and atraumatic.     Nose: Nose normal.     Mouth/Throat:     Mouth: Mucous membranes are moist.     Pharynx: Oropharynx is clear.  Eyes:     General: No scleral icterus.    Extraocular Movements: Extraocular movements intact.  Cardiovascular:     Rate and Rhythm: Normal rate and regular rhythm.     Heart sounds: Normal heart sounds. No murmur heard.   No friction rub. No gallop.  Pulmonary:     Effort: Pulmonary effort is normal. No respiratory distress.     Breath sounds: Normal breath sounds. No wheezing, rhonchi or rales.  Abdominal:     General: Bowel sounds are normal. There is no distension.     Palpations: Abdomen is soft.     Tenderness: There is no abdominal tenderness. There is no guarding or rebound.  Musculoskeletal:     Cervical back: Neck supple.     Right lower leg: No edema.     Left lower leg: No edema.  Skin:    General: Skin is warm and dry.     Coloration: Skin is not jaundiced or pale.  Neurological:     General: No focal deficit present.     Mental Status: She is alert and oriented to person, place, and time. Mental status is at baseline.  Psychiatric:        Mood and Affect: Mood normal.        Behavior: Behavior normal.        Thought Content: Thought content normal.        Judgment: Judgment normal.     Assessment:  Ms. Catherine Cross is a 75 y.o. female  who presents today for Colonoscopy for 10 year screening colonoscopy.  Plan:  Colonoscopy with possible intervention today  Colonoscopy with possible biopsy, control of bleeding, polypectomy, and interventions as necessary has been discussed with the  patient/patient representative. Informed consent was obtained from the patient/patient representative after explaining the indication, nature, and risks of the procedure including but not limited to death, bleeding, perforation, missed neoplasm/lesions, cardiorespiratory compromise, and reaction to medications. Opportunity for questions was given and appropriate answers were provided. Patient/patient representative has verbalized understanding is amenable to undergoing the procedure.   [name], DO  [facility]  Portions of the  record may have been created with voice recognition software. Occasional wrong-word or 'sound-a-like' substitutions may have occurred due to the inherent limitations of voice recognition software.  Read the chart carefully and recognize, using context, where substitutions may have occurred.

## 2020-11-25 NOTE — Anesthesia Postprocedure Evaluation (Signed)
Anesthesia Post Note  Patient: Catherine Cross  Procedure(s) Performed: COLONOSCOPY  Patient location during evaluation: Endoscopy Anesthesia Type: General Level of consciousness: awake and alert Pain management: pain level controlled Vital Signs Assessment: post-procedure vital signs reviewed and stable Respiratory status: spontaneous breathing, nonlabored ventilation, respiratory function stable and patient connected to nasal cannula oxygen Cardiovascular status: blood pressure returned to baseline and stable Postop Assessment: no apparent nausea or vomiting Anesthetic complications: no   No notable events documented.   Last Vitals:  Vitals:   11/25/20 0906 11/25/20 0916  BP: (!) 111/58 124/68  Pulse: 72 65  Resp: 15 12  Temp:    SpO2: 100% 100%    Last Pain:  Vitals:   11/25/20 0916  TempSrc:   PainSc: 0-No pain                 Precious Haws Shervin Cypert

## 2020-11-26 ENCOUNTER — Encounter: Payer: Self-pay | Admitting: Gastroenterology

## 2020-11-26 LAB — SURGICAL PATHOLOGY

## 2021-08-05 ENCOUNTER — Other Ambulatory Visit: Payer: Self-pay | Admitting: Obstetrics and Gynecology

## 2021-08-05 DIAGNOSIS — Z1231 Encounter for screening mammogram for malignant neoplasm of breast: Secondary | ICD-10-CM

## 2021-09-29 ENCOUNTER — Ambulatory Visit: Payer: Medicare HMO | Admitting: Podiatry

## 2021-09-29 DIAGNOSIS — E118 Type 2 diabetes mellitus with unspecified complications: Secondary | ICD-10-CM

## 2021-09-29 DIAGNOSIS — E119 Type 2 diabetes mellitus without complications: Secondary | ICD-10-CM

## 2021-09-29 NOTE — Progress Notes (Signed)
Subjective: Catherine Cross presents today referred by Baxter Hire, MD for diabetic foot evaluation.  Patient relates many year history of diabetes.  Patient denies any history of foot wounds.  Patient denies any history of numbness, tingling, burning, pins/needles sensations. 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   Digital mucous cyst 03/11/2015   Uterine procidentia 12/14/2014   B12 deficiency 11/17/2014   Basal cell carcinoma of skin of face 09/08/2014   Vaginal irritation from pessary ([place]) 07/28/2014   Urinary incontinence 11/24/2013   Iron deficiency 11/10/2013   Memory loss 11/10/2013   Benign essential hypertension 04/24/2013   Diabetes mellitus type 2 with complications ([place]) 73/71/0626   Hyperlipemia, mixed 04/24/2013    Past Surgical History:  Procedure Laterality Date   ABDOMINAL HYSTERECTOMY     BREAST BIOPSY Left 2001   benign   BREAST BIOPSY Right 2004   benign   cervical trachelectomy     COLONOSCOPY N/A 11/25/2020   Procedure: COLONOSCOPY;  Surgeon: [name], DO;  [facility] ENDOSCOPY;  Service: Gastroenterology;  Laterality: N/A;  DM   COLONOSCOPY WITH PROPOFOL     LAMINECTOMY     pessary     tvt mid urethral sling, a/p repair      Current Outpatient Medications on File Prior to Visit  Medication Sig Dispense Refill   Accu-Chek FastClix Lancets MISC USE TWICE A DAY AS DIRECTED     acetaminophen (TYLENOL) 325 MG tablet Take 2 tabs every 6 hours for the first 3 days after surgery, then every 6 hours as needed for pain.     Blood Glucose Monitoring Suppl (FIFTY50 GLUCOSE METER 2.0) w/Device KIT One Touch Meter  Kit     cefTAZidime (FORTAZ) 1 g injection Inject into the vein.     Hypodermic Needles-Disposable (MONOJECT MEDICATION TRANSF NDL) MISC      latanoprost (XALATAN) 0.005 % ophthalmic solution latanoprost 0.005 % eye drops     metFORMIN (GLUCOPHAGE) 500 MG tablet      polyethylene glycol powder (GLYCOLAX/MIRALAX) 17 GM/SCOOP powder MIX 17GM (AS MARKED IN BOTTLE TOP) IN 8 OUNCES OF WATER, MIX AND DRINK ONCE A DAY AS DIRECTED.     simvastatin (ZOCOR) 40 MG tablet Take 1 tablet by mouth at bedtime.     traMADol (ULTRAM) 50 MG tablet Take 1 tablet(s) EVERY 6 HOURS by oral route PRN pain     Accu-Chek FastClix Lancets MISC Apply topically.     acetaminophen (TYLENOL) 500 MG tablet Take 500 mg by mouth every 6 (six) hours as needed.     alendronate (FOSAMAX) 70 MG tablet Take 70 mg by mouth once a week. Take with a full glass of water on an empty stomach.     Ascorbic Acid (VITAMIN C) 100 MG tablet Take 100 mg by mouth daily.     aspirin EC 81 MG tablet Take 81 mg by mouth daily. Swallow whole.     aspirin EC 81 MG tablet aspirin 81 mg tablet,delayed release  Take 1 tablet every day by oral route.     b complex vitamins capsule Take 1  capsule by mouth daily.     Calcium Carb-Cholecalciferol (OYSTER SHELL CALCIUM W/D) 500-5 MG-MCG TABS Take 2 tablets by mouth daily.     CALCIUM CARBONATE-VITAMIN D PO Take by mouth.     cefUROXime (CEFTIN) 250 MG tablet      cephALEXin (KEFLEX) 500 MG capsule Take 1 capsule every 6 hours by oral route.     Cinnamon 500 MG capsule Take by mouth.     Cinnamon Bark POWD by Does not apply route.     estradiol (ESTRACE) 0.5 MG tablet Take 0.5 mg by mouth daily.     estradiol (ESTRACE) 1 MG tablet Take 1 mg by mouth daily.     glipiZIDE (GLUCOTROL) 10 MG tablet Take 10 mg by mouth daily before breakfast.     hydrochlorothiazide (HYDRODIURIL) 25 MG tablet Take 25 mg by mouth daily.     ibuprofen (ADVIL) 200 MG tablet Take 200 mg by mouth every 6 (six) hours as needed.      latanoprost (XALATAN) 0.005 % ophthalmic solution 1 drop at bedtime.     metFORMIN (GLUCOPHAGE) 1000 MG tablet Take 1,000 mg by mouth 2 (two) times daily with a meal.     ONETOUCH ULTRA test strip      polyethylene glycol-electrolytes (NULYTELY) 420 g solution Take 4,000 mLs by mouth once.     simvastatin (ZOCOR) 40 MG tablet Take 40 mg by mouth daily.     sitaGLIPtin (JANUVIA) 25 MG tablet Take 25 mg by mouth daily.     No current facility-administered medications on file prior to visit.     No Known Allergies  Social History   Occupational History   Not on file  Tobacco Use   Smoking status: Never   Smokeless tobacco: Never  Vaping Use   Vaping Use: Never used  Substance and Sexual Activity   Alcohol use: Never   Drug use: Never   Sexual activity: Not on file    Family History  Problem Relation Age of Onset   Breast cancer Neg Hx      There is no immunization history on file for this patient.  Review of systems: Positive Findings in bold print.  Constitutional:  chills, fatigue, fever, sweats, weight change Communication: Optometrist, sign Ecologist, hand writing, iPad/Android device Head: headaches, head injury Eyes: changes in vision, eye pain, glaucoma, cataracts, macular degeneration, diplopia, glare,  light sensitivity, eyeglasses or contacts, blindness Ears nose mouth throat: hearing impaired, hearing aids,  ringing in ears, deaf, sign language,  vertigo, nosebleeds,  rhinitis,  cold sores, snoring, swollen glands Cardiovascular: HTN, edema, arrhythmia, pacemaker in place, defibrillator in place, chest pain/tightness, chronic anticoagulation, blood clot, heart failure, MI Peripheral Vascular: leg cramps, varicose veins, blood clots, lymphedema, varicosities Respiratory:  asthma, difficulty breathing, denies congestion, SOB, wheezing, cough, emphysema Gastrointestinal: change in appetite or weight, abdominal pain, constipation, diarrhea, nausea, vomiting,  vomiting blood, change in bowel habits, abdominal pain, jaundice, rectal bleeding, hemorrhoids, GERD Genitourinary:  nocturia,  pain on urination, polyuria,  blood in urine, Foley catheter, urinary urgency, ESRD on hemodialysis Musculoskeletal: amputation, cramping, stiff joints, painful joints, decreased joint motion, fractures, OA, gout, hemiplegia, paraplegia, uses cane, wheelchair bound, uses walker, uses rollator Skin: +changes in toenails, color change, dryness, itching, mole changes,  rash, wound(s) Neurological: headaches, numbness in feet, paresthesias in feet, burning in feet, fainting,  seizures, change in speech, migraines, memory problems/poor historian, cerebral palsy, weakness, paralysis, CVA, TIA Endocrine: diabetes, hypothyroidism, hyperthyroidism,  goiter, dry mouth, flushing, heat  intolerance, cold intolerance,  excessive thirst, denies polyuria,  nocturia Hematological:  easy bleeding, excessive bleeding, easy bruising, enlarged lymph nodes, on long term blood thinner, history of past transusions Allergy/immunological:  hives, eczema, frequent infections, multiple drug allergies, seasonal allergies, transplant recipient, multiple food allergies Psychiatric:  anxiety, depression, mood disorder, suicidal ideations, hallucinations, insomnia  Objective: There were no vitals filed for this visit. Vascular Examination: Capillary refill time less than 3 seconds x 10 digits.  Dorsalis pedis pulses palpable 2 out of 4.  Posterior tibial pulses palpable 2 out of 4.  Digital hair not present x 10 digits.  Skin temperature gradient WNL b/l.  Dermatological Examination: Skin with normal turgor, texture and tone b/l  Toenails 1-5 b/l discolored, thick, dystrophic with subungual debris and pain with palpation to nailbeds due to thickness of nails.  Musculoskeletal: Muscle strength 5/5 to all LE muscle groups.  Neurological: Sensation intact with 10 gram monofilament.  Vibratory  sensation intact.  Assessment: NIDDM Encounter for diabetic foot examination  Plan: Discussed diabetic foot care principles. Literature dispensed on today. Patient to continue soft, supportive shoe gear daily. Patient to report any pedal injuries to medical professional immediately. Follow up one year. Patient/POA to call should there be a concern in the interim.

## 2021-10-05 ENCOUNTER — Ambulatory Visit
Admission: RE | Admit: 2021-10-05 | Discharge: 2021-10-05 | Disposition: A | Discharge status: Home / Self Care | Payer: Medicare HMO | Source: Ambulatory Visit | Attending: Obstetrics and Gynecology | Admitting: Obstetrics and Gynecology

## 2021-10-05 DIAGNOSIS — Z1231 Encounter for screening mammogram for malignant neoplasm of breast: Secondary | ICD-10-CM | POA: Insufficient documentation

## 2022-09-06 ENCOUNTER — Other Ambulatory Visit: Payer: Self-pay | Admitting: Obstetrics and Gynecology

## 2022-09-06 DIAGNOSIS — Z1231 Encounter for screening mammogram for malignant neoplasm of breast: Secondary | ICD-10-CM

## 2022-10-12 ENCOUNTER — Ambulatory Visit
Admission: RE | Admit: 2022-10-12 | Discharge: 2022-10-12 | Disposition: A | Discharge status: Home / Self Care | Payer: Medicare HMO | Source: Ambulatory Visit | Attending: Obstetrics and Gynecology | Admitting: Obstetrics and Gynecology

## 2022-10-12 DIAGNOSIS — Z1231 Encounter for screening mammogram for malignant neoplasm of breast: Secondary | ICD-10-CM | POA: Insufficient documentation

## 2023-09-14 ENCOUNTER — Other Ambulatory Visit: Payer: Self-pay | Admitting: Obstetrics and Gynecology

## 2023-09-14 DIAGNOSIS — Z1231 Encounter for screening mammogram for malignant neoplasm of breast: Secondary | ICD-10-CM

## 2023-11-20 ENCOUNTER — Ambulatory Visit
Admission: RE | Admit: 2023-11-20 | Discharge: 2023-11-20 | Disposition: A | Discharge status: Home / Self Care | Source: Ambulatory Visit | Attending: Obstetrics and Gynecology | Admitting: Obstetrics and Gynecology

## 2023-11-20 DIAGNOSIS — Z1231 Encounter for screening mammogram for malignant neoplasm of breast: Secondary | ICD-10-CM | POA: Insufficient documentation
